# Patient Record
Sex: Female | Born: 1935 | Race: White | Hispanic: No | Marital: Married
Health system: Midwestern US, Academic
[De-identification: ages and names within clinical notes are randomized; demographics above are authoritative.]

---

## 2017-10-27 ENCOUNTER — Encounter: Admit: 2017-10-27 | Discharge: 2017-10-27 | Payer: MEDICARE

## 2017-10-27 ENCOUNTER — Ambulatory Visit: Admit: 2017-10-27 | Discharge: 2017-10-28 | Payer: MEDICARE

## 2017-10-27 DIAGNOSIS — I10 Essential (primary) hypertension: Principal | ICD-10-CM

## 2019-06-01 ENCOUNTER — Ambulatory Visit: Admit: 2019-06-01 | Discharge: 2019-06-02

## 2019-06-01 ENCOUNTER — Encounter: Admit: 2019-06-01 | Discharge: 2019-06-01

## 2019-06-01 DIAGNOSIS — I509 Heart failure, unspecified: Secondary | ICD-10-CM

## 2019-06-01 DIAGNOSIS — R7989 Other specified abnormal findings of blood chemistry: Secondary | ICD-10-CM

## 2019-06-01 NOTE — Progress Notes
[facility]IENT CONSULT FOR HF.

## 2019-07-16 ENCOUNTER — Encounter: Admit: 2019-07-16 | Discharge: 2019-07-16

## 2019-07-16 NOTE — Telephone Encounter
On 07/16/19 Medical records received records from Dr Earvin Hansen. They can be accessed through the [facility] for the 07/27/19 appt with Dr Aris [place].

## 2019-07-16 NOTE — Telephone Encounter
07/16/19 Spoke to pt about records info. Pt states she hasn't seen a cardiologist since last seen here in 2015.   Requested records from Dr. Earvin Hansen edh

## 2019-07-27 ENCOUNTER — Ambulatory Visit: Admit: 2019-07-27 | Discharge: 2019-07-28

## 2019-07-27 ENCOUNTER — Encounter: Admit: 2019-07-27 | Discharge: 2019-07-27

## 2019-07-27 DIAGNOSIS — R0600 Dyspnea, unspecified: Secondary | ICD-10-CM

## 2019-07-27 DIAGNOSIS — I10 Essential (primary) hypertension: Secondary | ICD-10-CM

## 2019-07-27 DIAGNOSIS — E785 Hyperlipidemia, unspecified: Secondary | ICD-10-CM

## 2019-07-27 DIAGNOSIS — I5030 Unspecified diastolic (congestive) heart failure: Secondary | ICD-10-CM

## 2019-07-27 DIAGNOSIS — R0789 Other chest pain: Secondary | ICD-10-CM

## 2019-07-27 DIAGNOSIS — J449 Chronic obstructive pulmonary disease, unspecified: Secondary | ICD-10-CM

## 2019-07-27 DIAGNOSIS — E039 Hypothyroidism, unspecified: Secondary | ICD-10-CM

## 2019-07-27 NOTE — Progress Notes
Patient in clinic today to f/u after seen in [facility] as a consult for LE edema.  Bilateral calf measurements:  L  16"   R  19.5" .  Patient will be returning in 10 days for a nursing visit to reassess edema.  Please measure bilateral calf circumference.  BMP and BNP will be drawn the day prior to nursing visit.  Review with TLR if diuretics need to be adjusted.

## 2019-07-27 NOTE — Progress Notes
Date of Service: 07/27/2019    Priscilla Richard is a 83 y.o. female.       HPI       I saw Ms. Fedak today about her persistent lower extremity edema, COPD, hypertension, hyperlipidemia.  She also has a history of atrial flutter, is on Eliquis.  Her edema has been problematic with lower extremity secondary cellulitis.  I would like to eradicate her edema entirely if possible.  To that end, we are going to increase her metolazone and her potassium supplements, have her back in 10 days for blood pressure, weight, and shin circumference measurement to decide about ongoing diuretic titration.  The husband tells me that her edema is about 50% better on her current regimen, but still a lot of way to go.    ([id number])           Vitals:    07/27/19 1302   BP: 122/68   BP Source: Arm, Right Lower   Pulse: 52   Temp: 36.7 ???C (98.1 ???F)   SpO2: 97%   Height: 1.626 m (5' 4)   PainSc: Zero     Body mass index is 53.73 kg/m???.     Past Medical History  Patient Active Problem List    Diagnosis Date Noted   ??? Chest tightness 01/26/2014   ??? COPD (chronic obstructive pulmonary disease) (HCC) 01/26/2014   ??? Hypertension 01/26/2014   ??? Hyperlipemia 01/26/2014   ??? Hypothyroid 01/26/2014         Review of Systems   Constitution: Negative.   HENT: Negative.    Eyes: Negative.    Cardiovascular: Negative.    Respiratory: Negative.    Endocrine: Negative.    Hematologic/Lymphatic: Negative.    Skin: Negative.    Musculoskeletal: Negative.    Gastrointestinal: Negative.    Genitourinary: Negative.    Neurological: Negative.    Psychiatric/Behavioral: Negative.    Allergic/Immunologic: Negative.        Physical Exam  Examination reveals compression stockings bilaterally but still gross edema to the kneecaps.  Her weight is obese.    ([id number])        Cardiovascular Studies  EKG today shows sinus rhythm.    ([id number])        Problems Addressed Today  No diagnosis found.    Assessment and Plan In summary, this is an 83 year old lady with lower extremity edema, cellulitis, insulin-dependent diabetes mellitus, atrial flutter, pulmonary hypertension.  We are going to try to eradicate her edema entirely by increasing her metolazone, having her back in 10 days, recheck her BNP and BMP at that time, adjust her potassium accordingly, and make adjustments as far as whether or not we are making progress with eradicating her edema.    ([id number])             Current Medications (including today's revisions)  ??? albuterol 0.5% (PROVENTIL; VENTOLIN) 2.5 mg/0.5 mL nebu nebulizer solution Inhale 2.5 mg solution as directed every 6 hours as needed.   ??? albuterol-ipratropium (DUONEB) 0.5 mg-3 mg(2.5 mg base)/3 mL nebulizer solution Inhale 3 mL solution as directed four times daily.   ??? apixaban (ELIQUIS) 2.5 mg tablet Take 2.5 mg by mouth twice daily.   ??? budesonide/formoterol (SYMBICORT) 160/4.5 mcg HFAA inhalation Inhale 2 Puffs by mouth twice daily.   ??? cyanocobalamin (VITAMIN B-12) 1,000 mcg tablet Take 1,000 mcg by mouth daily.   ??? fluticasone (FLONASE) 50 mcg/actuation nasal spray Apply 2 Sprays to each nostril as directed daily.   ???  fluticasone-salmeterol(+) (ADVAIR HFA) 115-21 mcg/actuation inhaler Inhale 2 puffs by mouth into the lungs twice daily.   ??? INSULN ASP PRT/INSULIN ASPART (NOVOLOG MIX 70-30 FLEXPEN SC) Inject 45 Units under the skin twice daily. 40 units AM 30 units PM   ??? latanoprost (XALATAN) 0.005 % ophthalmic solution Place 1 drop into or around eye(s) at bedtime daily.   ??? levothyroxine (SYNTHROID) 112 mcg tablet Take 112 mcg by mouth daily.   ??? loratadine (CLARITIN) 10 mg tablet Take 10 mg by mouth every morning.   ??? metOLazone (ZAROXOLYN) 2.5 mg tablet Take 2.5 mg by mouth. Every other day   ??? montelukast (SINGULAIR) 10 mg tablet Take 10 mg by mouth at bedtime daily.   ??? nystatin (NYSTOP) 100,000 unit/g topical powder Apply  topically to affected area four times daily. ??? potassium chloride SR (K-DUR) 20 mEq tablet Take 60 mEq by mouth daily.   ??? rosuvastatin (CRESTOR) 10 mg tablet Take 10 mg by mouth daily.   ??? tamoxifen (NOLVADEX) 20 mg tablet Take 20 mg by mouth daily.   ??? tolterodine LA(+) (DETROL LA) 4 mg capsule Take 4 mg by mouth daily.   ??? torsemide (DEMADEX) 10 mg tablet Take 10 mg by mouth daily.

## 2019-07-30 ENCOUNTER — Encounter: Admit: 2019-07-30 | Discharge: 2019-07-30

## 2019-07-30 DIAGNOSIS — I10 Essential (primary) hypertension: Secondary | ICD-10-CM

## 2019-07-30 DIAGNOSIS — R0600 Dyspnea, unspecified: Secondary | ICD-10-CM

## 2019-07-30 DIAGNOSIS — R0789 Other chest pain: Secondary | ICD-10-CM

## 2019-07-30 DIAGNOSIS — I5030 Unspecified diastolic (congestive) heart failure: Secondary | ICD-10-CM

## 2019-07-30 LAB — BASIC METABOLIC PANEL
Lab: 1.1 — ABNORMAL HIGH (ref 0.57–1.11)
Lab: 136 — ABNORMAL HIGH (ref 70–105)
Lab: 22 — ABNORMAL HIGH (ref 9.8–20.1)
Lab: 9.3
Lab: 100 mL/min — ABNORMAL LOW (ref >60)
Lab: 144 mg/dL — ABNORMAL HIGH (ref 0.4–1.24)
Lab: 3.6 mg/dL (ref 8.5–10.6)
Lab: 31 mL/min — ABNORMAL LOW (ref >60)

## 2019-08-04 LAB — BNP (B-TYPE NATRIURETIC PEPTI): Lab: 117 g/dL — ABNORMAL HIGH (ref 0–100)

## 2019-08-05 ENCOUNTER — Encounter: Admit: 2019-08-05 | Discharge: 2019-08-05

## 2019-08-05 DIAGNOSIS — R0600 Dyspnea, unspecified: Secondary | ICD-10-CM

## 2019-08-05 DIAGNOSIS — I5030 Unspecified diastolic (congestive) heart failure: Secondary | ICD-10-CM

## 2019-08-05 DIAGNOSIS — R600 Localized edema: Secondary | ICD-10-CM

## 2019-08-05 DIAGNOSIS — R0789 Other chest pain: Secondary | ICD-10-CM

## 2019-08-05 DIAGNOSIS — I10 Essential (primary) hypertension: Secondary | ICD-10-CM

## 2019-08-06 MED ORDER — METOLAZONE 2.5 MG PO TAB
5 mg | ORAL_TABLET | Freq: Every day | ORAL | 0 refills | 84.00000 days | Status: DC
Start: 2019-08-06 — End: 2019-08-09

## 2019-08-06 NOTE — Progress Notes
Patient presents to clinic for reassessment of LE edema and blood pressure check after recently increasing spironolactone.  Her stated weight today is 291.  Which is down from 295.  Her bp today is 124/60 p 77.  Patient reports she is feeling better and has no complaints.  Measurements of LE calf: R 19" and L is 17".  Which R is done 0.5" and L is sl increased, which could be related to how the pt has her legs wrapped with ace wrap.  Patient was instructed to increase metolazone for 10 days then resume her usual dose.  Plan to review with TLR and call pt back if he has further recommendations.

## 2019-08-06 NOTE — Progress Notes
Reviewed with TLR.  Plan to increase metolazone to 5 mg daily, recheck LE edema, and bmp in 2 weeks.  Pt will monitor weight at home. Most recent weight was 291.  Called pt with instructions and she verbalizes understanding. Lab order faxed to [facility] and copy mailed to patient.

## 2019-08-07 ENCOUNTER — Encounter: Admit: 2019-08-07 | Discharge: 2019-08-07

## 2019-08-09 ENCOUNTER — Encounter: Admit: 2019-08-09 | Discharge: 2019-08-09

## 2019-08-09 MED ORDER — METOLAZONE 2.5 MG PO TAB
5 mg | ORAL_TABLET | Freq: Every day | ORAL | 3 refills | 84.00000 days | Status: DC
Start: 2019-08-09 — End: 2019-08-09

## 2019-08-09 MED ORDER — METOLAZONE 5 MG PO TAB
5 mg | ORAL_TABLET | Freq: Every day | ORAL | 3 refills | 84.00000 days | Status: DC
Start: 2019-08-09 — End: 2019-08-25

## 2019-08-23 LAB — BASIC METABOLIC PANEL
Lab: 1.2 — ABNORMAL HIGH (ref 0.57–1.11)
Lab: 143 (ref 3–12)
Lab: 18 — ABNORMAL HIGH (ref 0–14)
Lab: 203 — ABNORMAL HIGH (ref 70–105)
Lab: 3.7 mL/min (ref >60)
Lab: 9.1
Lab: 97 mL/min — ABNORMAL LOW (ref 98–107)

## 2019-08-24 ENCOUNTER — Encounter: Admit: 2019-08-24 | Discharge: 2019-08-24

## 2019-08-24 DIAGNOSIS — I10 Essential (primary) hypertension: Secondary | ICD-10-CM

## 2019-08-24 DIAGNOSIS — R600 Localized edema: Secondary | ICD-10-CM

## 2019-08-24 NOTE — Progress Notes
Patient presents to clinic for bp check and to assess LE edema.  Today bp is 134/74 p 66.  Pt weight is down to 288 lb. R calf circumference 18 3/4"  L 18 1/2 ".  Patient is taking metolazone 5 mg daily.  Weight is down, bp is stable and pt has no complaints. Pt will continue to monitor bp and weight and call our office with questions or concerns.  Will review with TLR metolazone dose, pt is concerned taking 5 mg daily since sl bump in creatinine. Asked pt to take metolazone 5 mg every other day until review with TLR.  Plan to call pt with recommendations.

## 2019-08-25 ENCOUNTER — Encounter: Admit: 2019-08-25 | Discharge: 2019-08-25

## 2019-08-25 MED ORDER — METOLAZONE 5 MG PO TAB
5 mg | ORAL_TABLET | ORAL | 3 refills | 84.00000 days | Status: DC
Start: 2019-08-25 — End: 2020-07-21

## 2019-08-25 NOTE — Telephone Encounter
-----   Message from Betsy Pries, RN sent at 08/24/2019  4:38 PM CDT -----  Regarding: metolazone dose  Ask TLR

## 2019-10-05 ENCOUNTER — Encounter: Admit: 2019-10-05 | Discharge: 2019-10-05 | Payer: MEDICARE

## 2019-10-11 ENCOUNTER — Encounter: Admit: 2019-10-11 | Discharge: 2019-10-11 | Payer: MEDICARE

## 2019-10-12 ENCOUNTER — Encounter: Admit: 2019-10-12 | Discharge: 2019-10-12 | Payer: MEDICARE

## 2019-10-12 DIAGNOSIS — E785 Hyperlipidemia, unspecified: Secondary | ICD-10-CM

## 2019-10-12 DIAGNOSIS — R0600 Dyspnea, unspecified: Secondary | ICD-10-CM

## 2019-10-12 DIAGNOSIS — E039 Hypothyroidism, unspecified: Secondary | ICD-10-CM

## 2019-10-12 DIAGNOSIS — R0789 Other chest pain: Secondary | ICD-10-CM

## 2019-10-12 DIAGNOSIS — I10 Essential (primary) hypertension: Secondary | ICD-10-CM

## 2019-10-12 DIAGNOSIS — J449 Chronic obstructive pulmonary disease, unspecified: Secondary | ICD-10-CM

## 2019-10-14 LAB — BNP (B-TYPE NATRIURETIC PEPTI): Lab: 81 (ref 3–12)

## 2019-10-14 LAB — BASIC METABOLIC PANEL
Lab: 1.1 K/UL — ABNORMAL HIGH (ref 0.57–1.11)
Lab: 142 % — ABNORMAL LOW (ref 36–45)
Lab: 17 % — ABNORMAL HIGH (ref 0–14)
Lab: 199 FL — ABNORMAL HIGH (ref 70–105)
Lab: 23 % — ABNORMAL HIGH (ref 9.8–20.1)
Lab: 3.3 FL — ABNORMAL LOW (ref 3.5–5.1)
Lab: 32 g/dL — ABNORMAL HIGH (ref 23–31)
Lab: 47 % — ABNORMAL LOW (ref >59)
Lab: 9.1 % (ref 41–77)
Lab: 96 pg — ABNORMAL LOW (ref 98–107)

## 2019-10-14 LAB — MAGNESIUM: Lab: 2 U/L (ref 25–110)

## 2019-10-15 ENCOUNTER — Encounter: Admit: 2019-10-15 | Discharge: 2019-10-15 | Payer: MEDICARE

## 2019-10-15 DIAGNOSIS — R0789 Other chest pain: Secondary | ICD-10-CM

## 2019-10-15 DIAGNOSIS — I10 Essential (primary) hypertension: Secondary | ICD-10-CM

## 2019-10-15 DIAGNOSIS — E785 Hyperlipidemia, unspecified: Secondary | ICD-10-CM

## 2019-10-15 DIAGNOSIS — E039 Hypothyroidism, unspecified: Secondary | ICD-10-CM

## 2020-01-04 ENCOUNTER — Encounter: Admit: 2020-01-04 | Discharge: 2020-01-04 | Payer: MEDICARE

## 2020-01-04 DIAGNOSIS — R0789 Other chest pain: Secondary | ICD-10-CM

## 2020-01-04 DIAGNOSIS — I10 Essential (primary) hypertension: Secondary | ICD-10-CM

## 2020-01-04 DIAGNOSIS — E039 Hypothyroidism, unspecified: Secondary | ICD-10-CM

## 2020-01-04 DIAGNOSIS — J449 Chronic obstructive pulmonary disease, unspecified: Secondary | ICD-10-CM

## 2020-01-04 DIAGNOSIS — E785 Hyperlipidemia, unspecified: Secondary | ICD-10-CM

## 2020-01-04 DIAGNOSIS — R0600 Dyspnea, unspecified: Secondary | ICD-10-CM

## 2020-04-04 ENCOUNTER — Encounter: Admit: 2020-04-04 | Discharge: 2020-04-04 | Payer: MEDICARE

## 2020-04-04 DIAGNOSIS — J449 Chronic obstructive pulmonary disease, unspecified: Secondary | ICD-10-CM

## 2020-04-04 DIAGNOSIS — E039 Hypothyroidism, unspecified: Secondary | ICD-10-CM

## 2020-04-04 DIAGNOSIS — R0789 Other chest pain: Secondary | ICD-10-CM

## 2020-04-04 DIAGNOSIS — E785 Hyperlipidemia, unspecified: Secondary | ICD-10-CM

## 2020-04-04 DIAGNOSIS — I10 Essential (primary) hypertension: Secondary | ICD-10-CM

## 2020-04-04 DIAGNOSIS — R0600 Dyspnea, unspecified: Secondary | ICD-10-CM

## 2020-04-04 NOTE — Patient Instructions
Hold Crestor x 2weeks  Approx 4/20  Call us to report if this has improved symptoms     Follow up as directed.  Call sooner if issues.  Call the [place] nursing line at [phone].  Leave a detailed message for the nurse in [place] with how we can assist you and we will call you back.

## 2020-05-30 ENCOUNTER — Encounter: Admit: 2020-05-30 | Discharge: 2020-05-30 | Payer: MEDICARE

## 2020-06-30 ENCOUNTER — Encounter: Admit: 2020-06-30 | Discharge: 2020-06-30 | Payer: MEDICARE

## 2020-07-18 ENCOUNTER — Encounter: Admit: 2020-07-18 | Discharge: 2020-07-18 | Payer: MEDICARE

## 2020-07-18 DIAGNOSIS — E785 Hyperlipidemia, unspecified: Secondary | ICD-10-CM

## 2020-07-18 LAB — LIPID PROFILE
Lab: 95
Lab: 114 MMOL/L — ABNORMAL LOW (ref 100–108)
Lab: 151
Lab: 23 mg/dL — ABNORMAL HIGH (ref 70–99)
Lab: 33 MMOL/L — ABNORMAL LOW (ref >40)
Lab: 5 — ABNORMAL LOW (ref >60)

## 2020-07-20 ENCOUNTER — Encounter: Admit: 2020-07-20 | Discharge: 2020-07-20 | Payer: MEDICARE

## 2020-07-20 DIAGNOSIS — E785 Hyperlipidemia, unspecified: Secondary | ICD-10-CM

## 2020-07-20 DIAGNOSIS — I10 Essential (primary) hypertension: Secondary | ICD-10-CM

## 2020-07-20 DIAGNOSIS — E039 Hypothyroidism, unspecified: Secondary | ICD-10-CM

## 2020-07-20 DIAGNOSIS — R0789 Other chest pain: Secondary | ICD-10-CM

## 2020-07-20 DIAGNOSIS — R29898 Other symptoms and signs involving the musculoskeletal system: Secondary | ICD-10-CM

## 2020-07-20 DIAGNOSIS — J449 Chronic obstructive pulmonary disease, unspecified: Secondary | ICD-10-CM

## 2020-07-20 DIAGNOSIS — R0600 Dyspnea, unspecified: Secondary | ICD-10-CM

## 2020-07-20 DIAGNOSIS — M48061 Spinal stenosis, lumbar region without neurogenic claudication: Secondary | ICD-10-CM

## 2020-07-20 NOTE — Patient Instructions
Lumbar Spine MRI at [facility]    Scheduling [phone]

## 2020-07-21 ENCOUNTER — Encounter: Admit: 2020-07-21 | Discharge: 2020-07-21 | Payer: MEDICARE

## 2020-07-21 MED ORDER — METOLAZONE 5 MG PO TAB
2.5 mg | ORAL_TABLET | ORAL | 1 refills | 84.00000 days | Status: AC
Start: 2020-07-21 — End: ?

## 2021-04-10 ENCOUNTER — Encounter: Admit: 2021-04-10 | Discharge: 2021-04-10 | Payer: MEDICARE

## 2021-04-10 MED ORDER — METOLAZONE 5 MG PO TAB
ORAL_TABLET | ORAL | 2 refills | 84.00000 days | Status: AC
Start: 2021-04-10 — End: ?

## 2021-10-30 DEATH — deceased

## 2022-01-13 ENCOUNTER — Encounter: Admit: 2022-01-13 | Discharge: 2022-01-13 | Payer: MEDICARE

## 2022-01-13 MED ORDER — METOLAZONE 5 MG PO TAB
ORAL_TABLET | 2 refills
Start: 2022-01-13 — End: ?

## 2022-01-14 IMAGING — CT BRAIN WO(Adult)
3 of 4 series · 15 of 47 positions shown, 18 images · non-contrast
Comparison: none

[Series 4: brain cor 5.00 hr40 s3 · coronal · 0.33mm/px · 3 of 42 slices shown]
[im 14/42  brain]
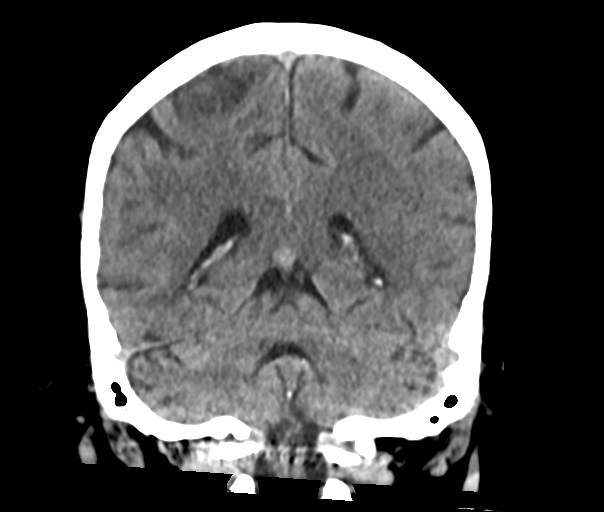
[im 19/42  brain]
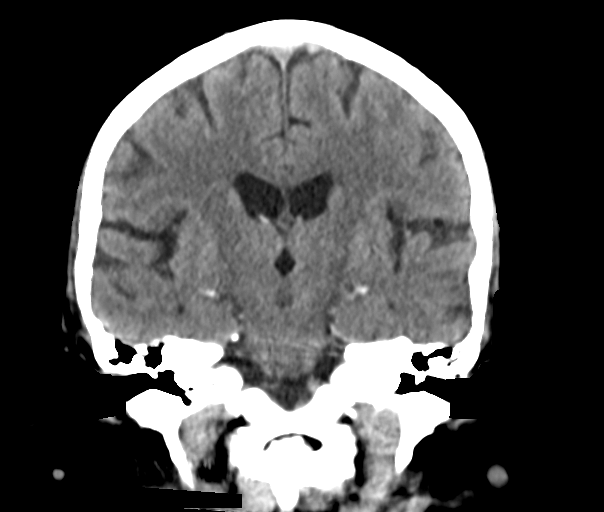
[im 23/42  brain]
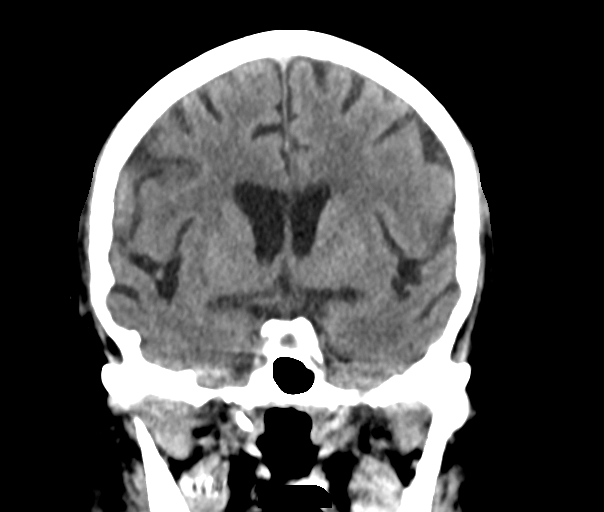

[Series 6: brain sag 5.00 hr40 s3 · sagittal · 0.33mm/px · 3 of 40 slices shown]
[im 14/40  brain]
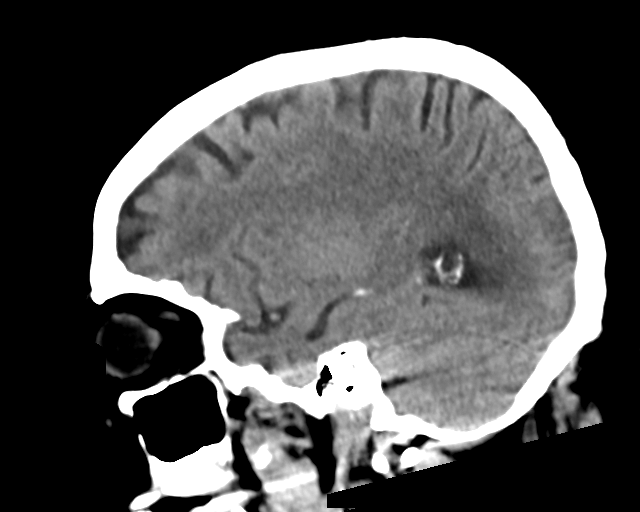
[im 20/40  brain]
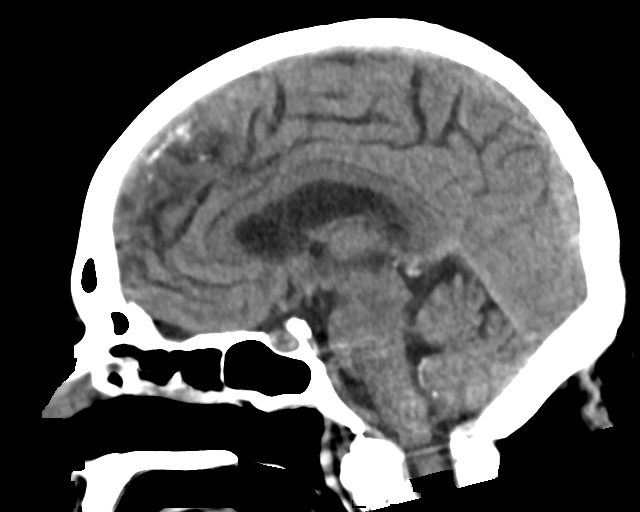
[im 27/40  brain]
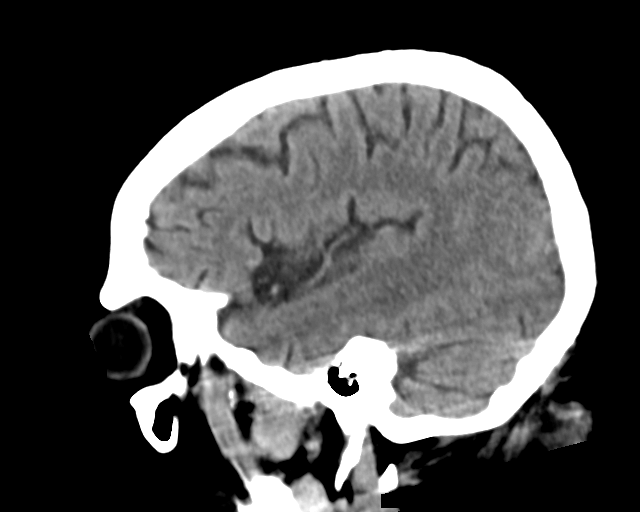

[Series 8: brain ax 2.00 hr60 s3 · axial · 0.39mm/px · z∈[-611,-480]mm · 9 of 84 slices shown, 12 images]
[im 8/84  brain]
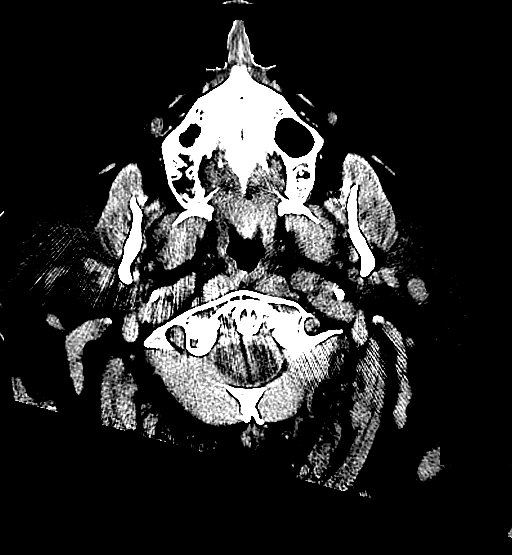
[im 8/84  bone]
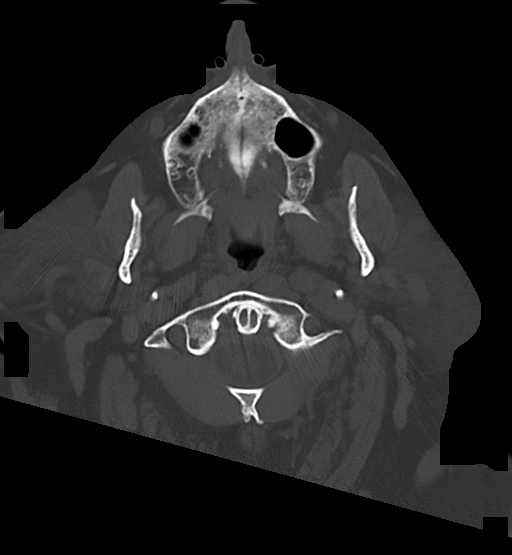
[im 16/84  brain]
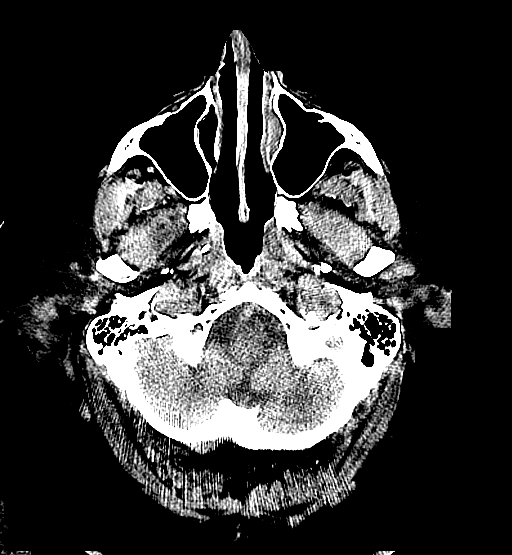
[im 24/84  brain]
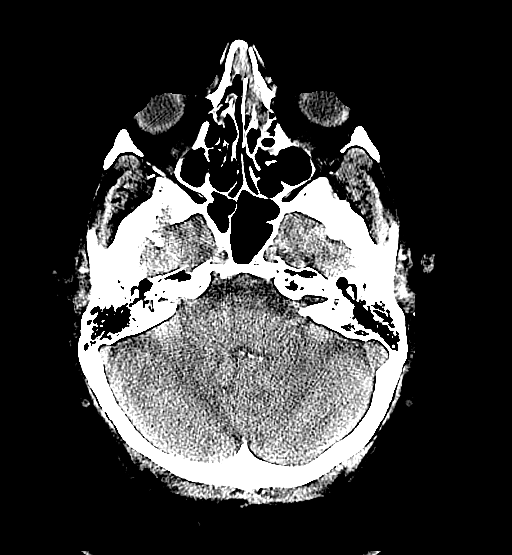
[im 32/84  brain]
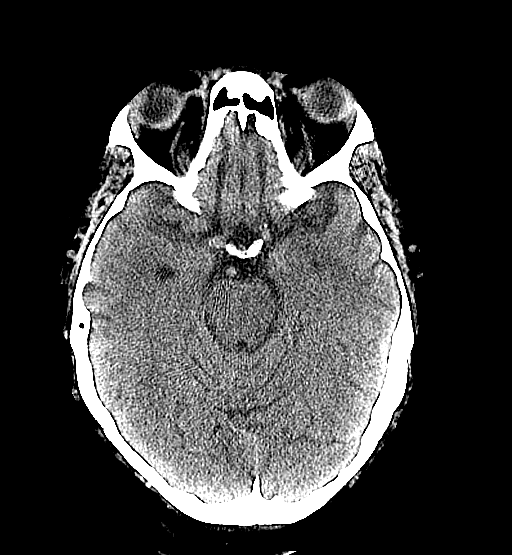
[im 44/84  brain]
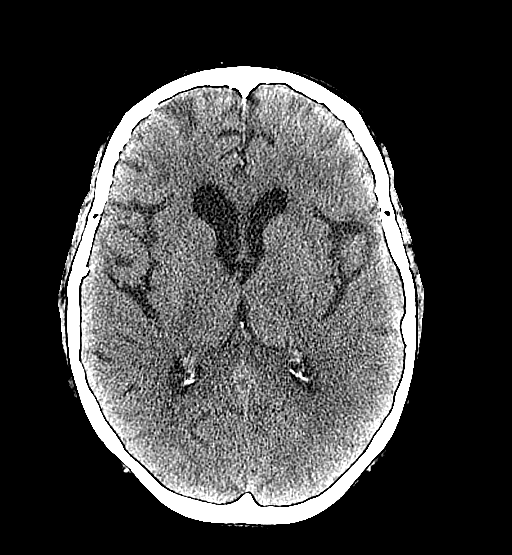
[im 44/84  bone]
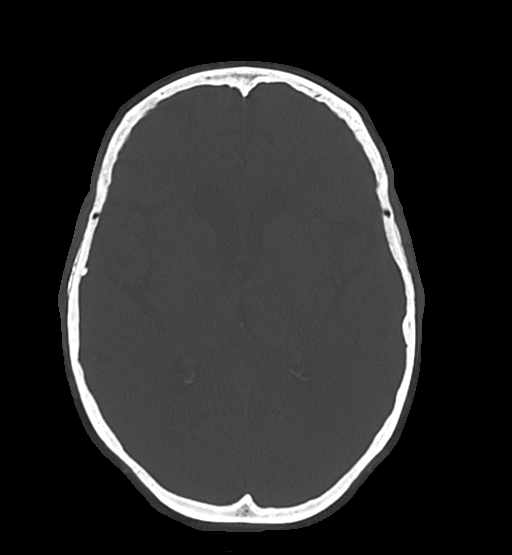
[im 52/84  brain]
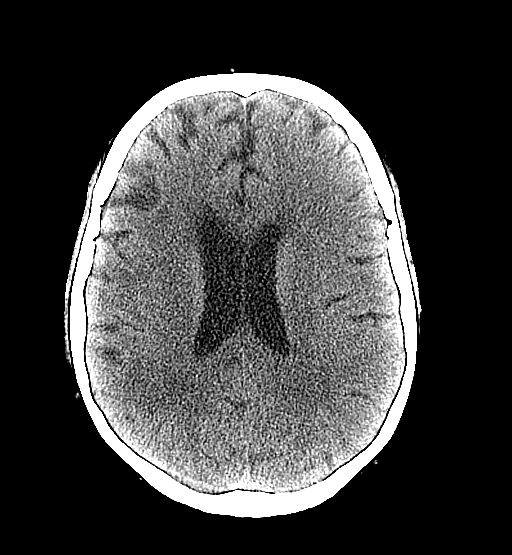
[im 60/84  brain]
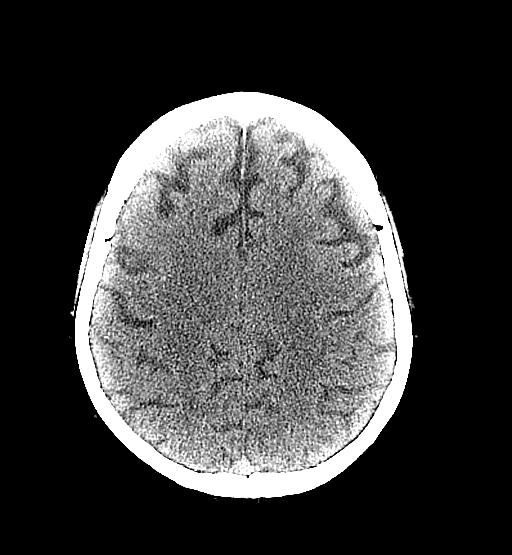
[im 68/84  brain]
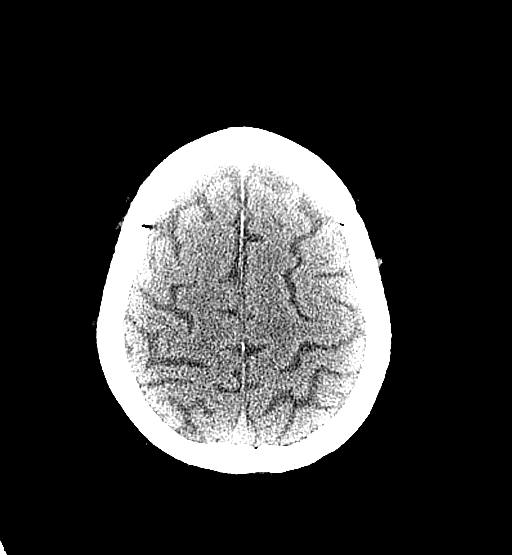
[im 76/84  brain]
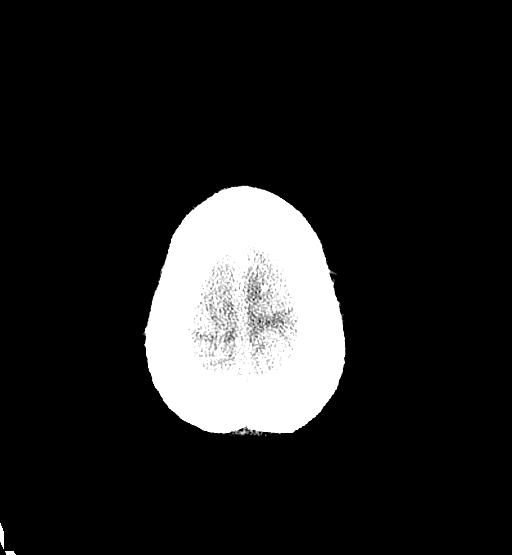
[im 76/84  bone]
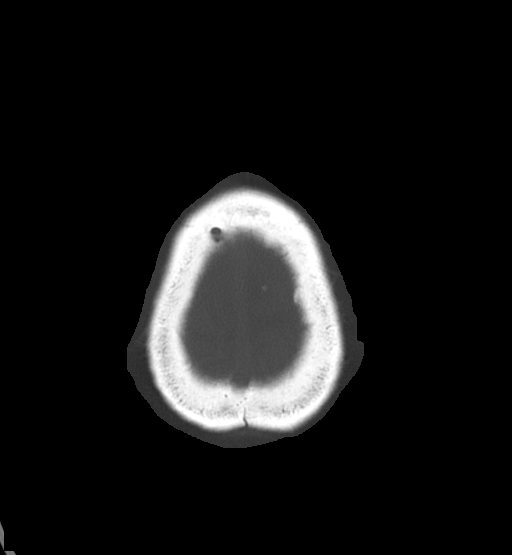

[15 of 47 positions shown; findings below may reference images not displayed]

DIAGNOSTIC STUDIES

EXAM

CT head without contrast.

INDICATION

new onset weakness
WEAKNESS. CT/NM 0/0. TJ/KF

TECHNIQUE

All CT scans at this facility use dose modulation, iterative reconstruction, and/or weight based
dosing when appropriate to reduce radiation dose to as low as reasonably achievable.

Number of previous computed tomography exams in the last 12 months is 0 .

Number of previous nuclear medicine myocardial perfusion studies in the last 12 months is 0 .

COMPARISONS

None available

FINDINGS

There is extensive cortical atrophy and proportional enlargement of the ventricular system. Small
vessel ischemic disease is evident. No intracranial hemorrhage or mass effect is seen. Bony
calvarium is normal. Calcification of the cavernous carotid arteries is evident.

IMPRESSION

Cortical atrophy and small vessel ischemic disease. No intracranial mass or hemorrhage is seen.

Tech Notes:

WEAKNESS. CT/NM 0/0. TJ/KF

## 2022-01-16 IMAGING — CR [ID]
2 series · 3 of 3 positions shown · non-contrast
Comparison: none

[chest ap]
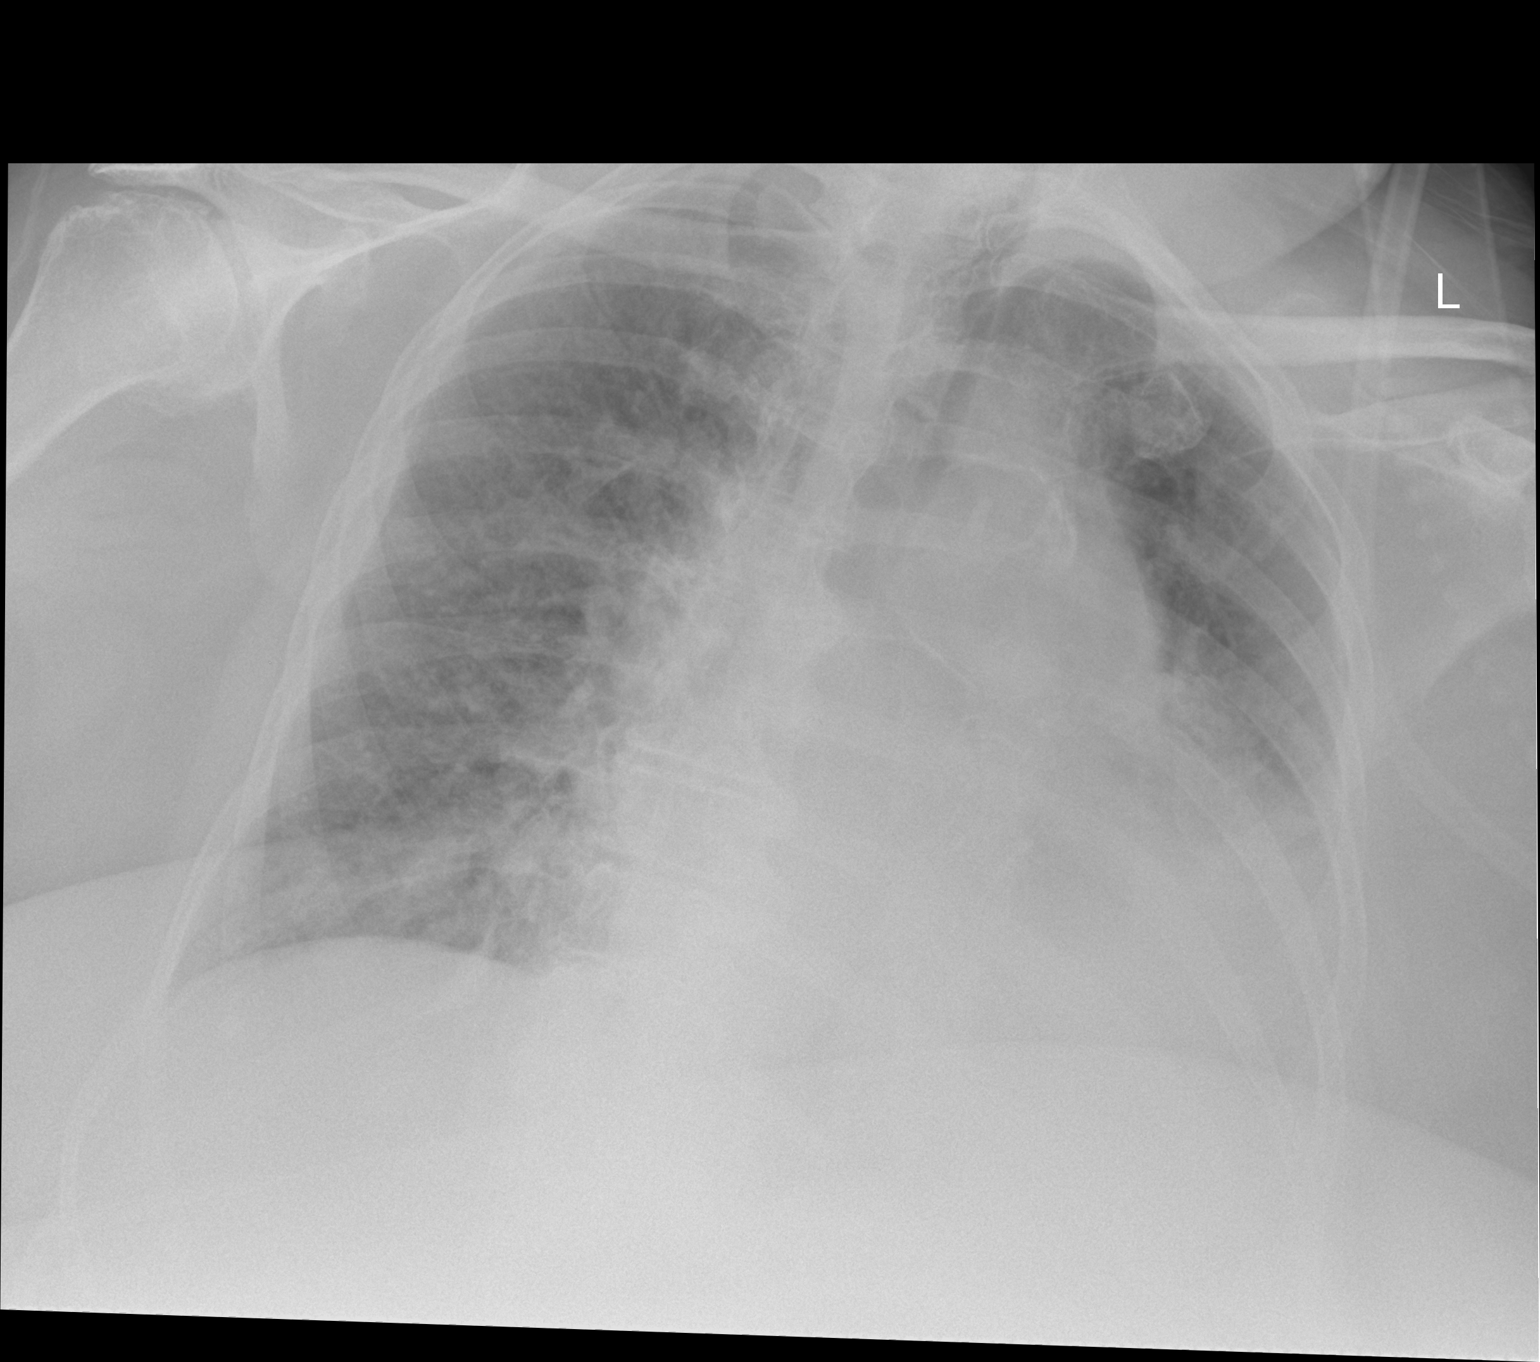

[chest preemie ap · 0.14mm/px · 2 of 2 slices shown]
[im 1/2]
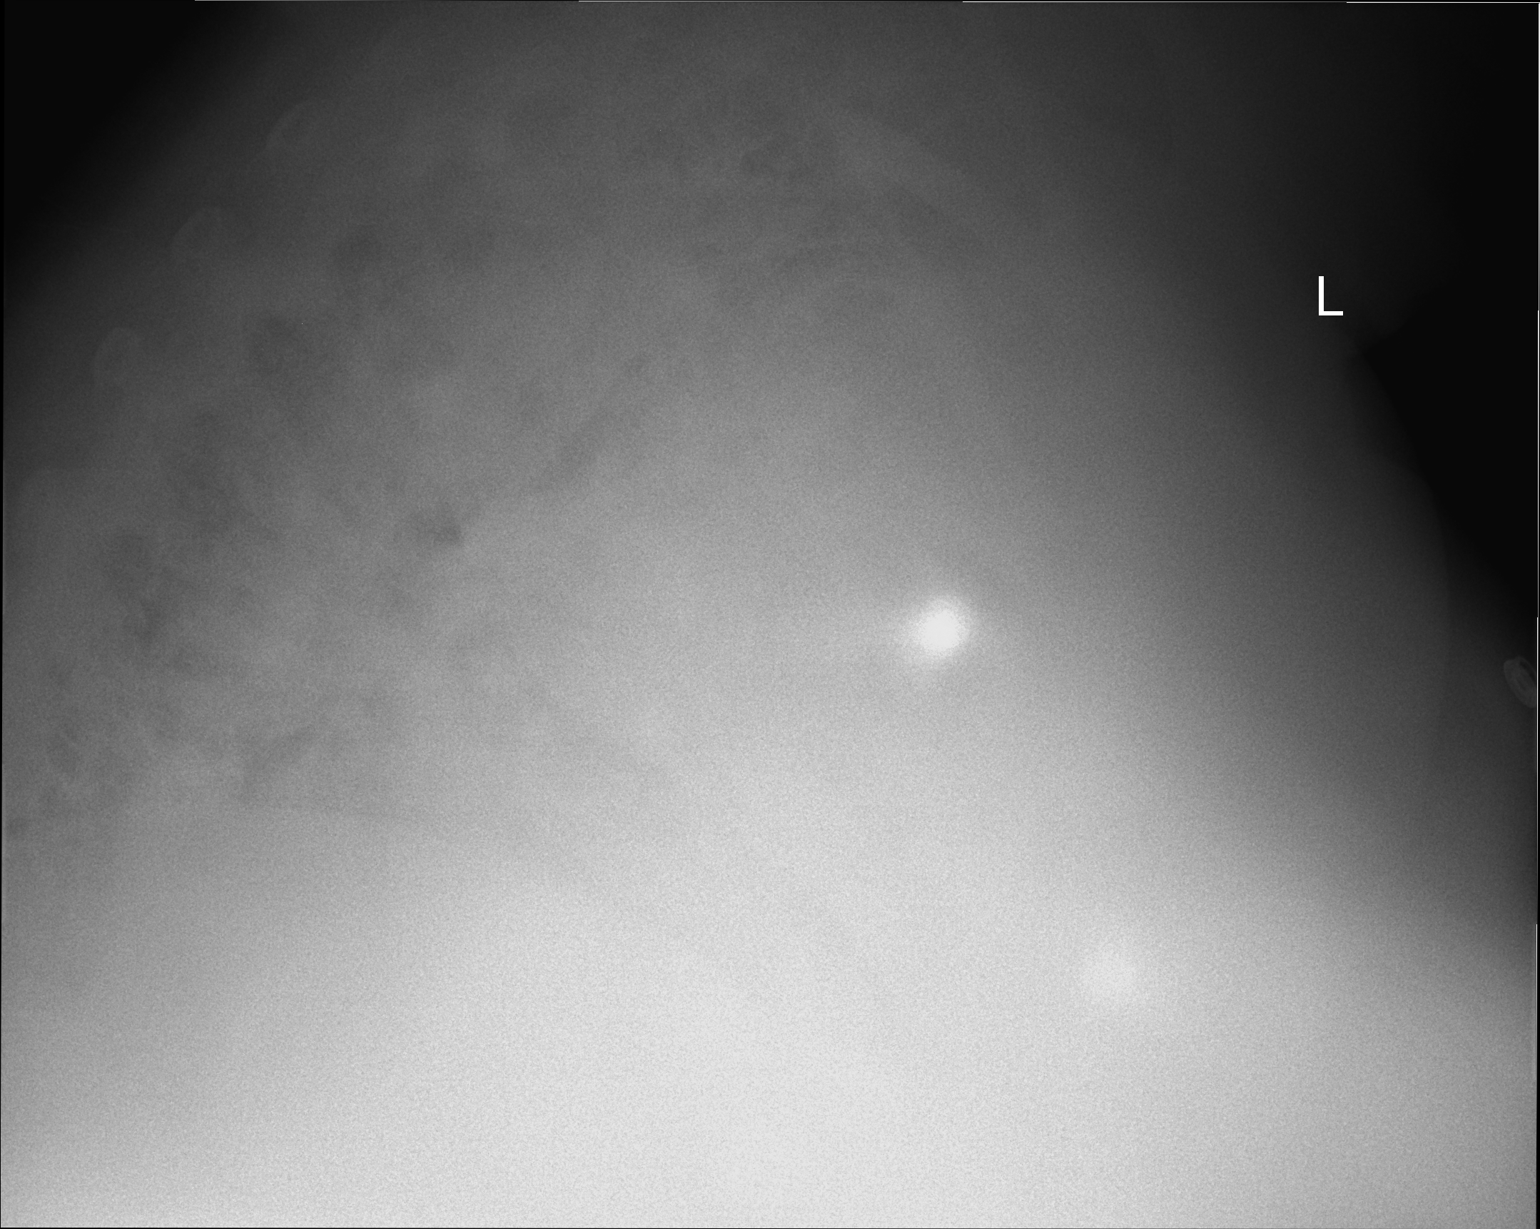
[im 2/2]
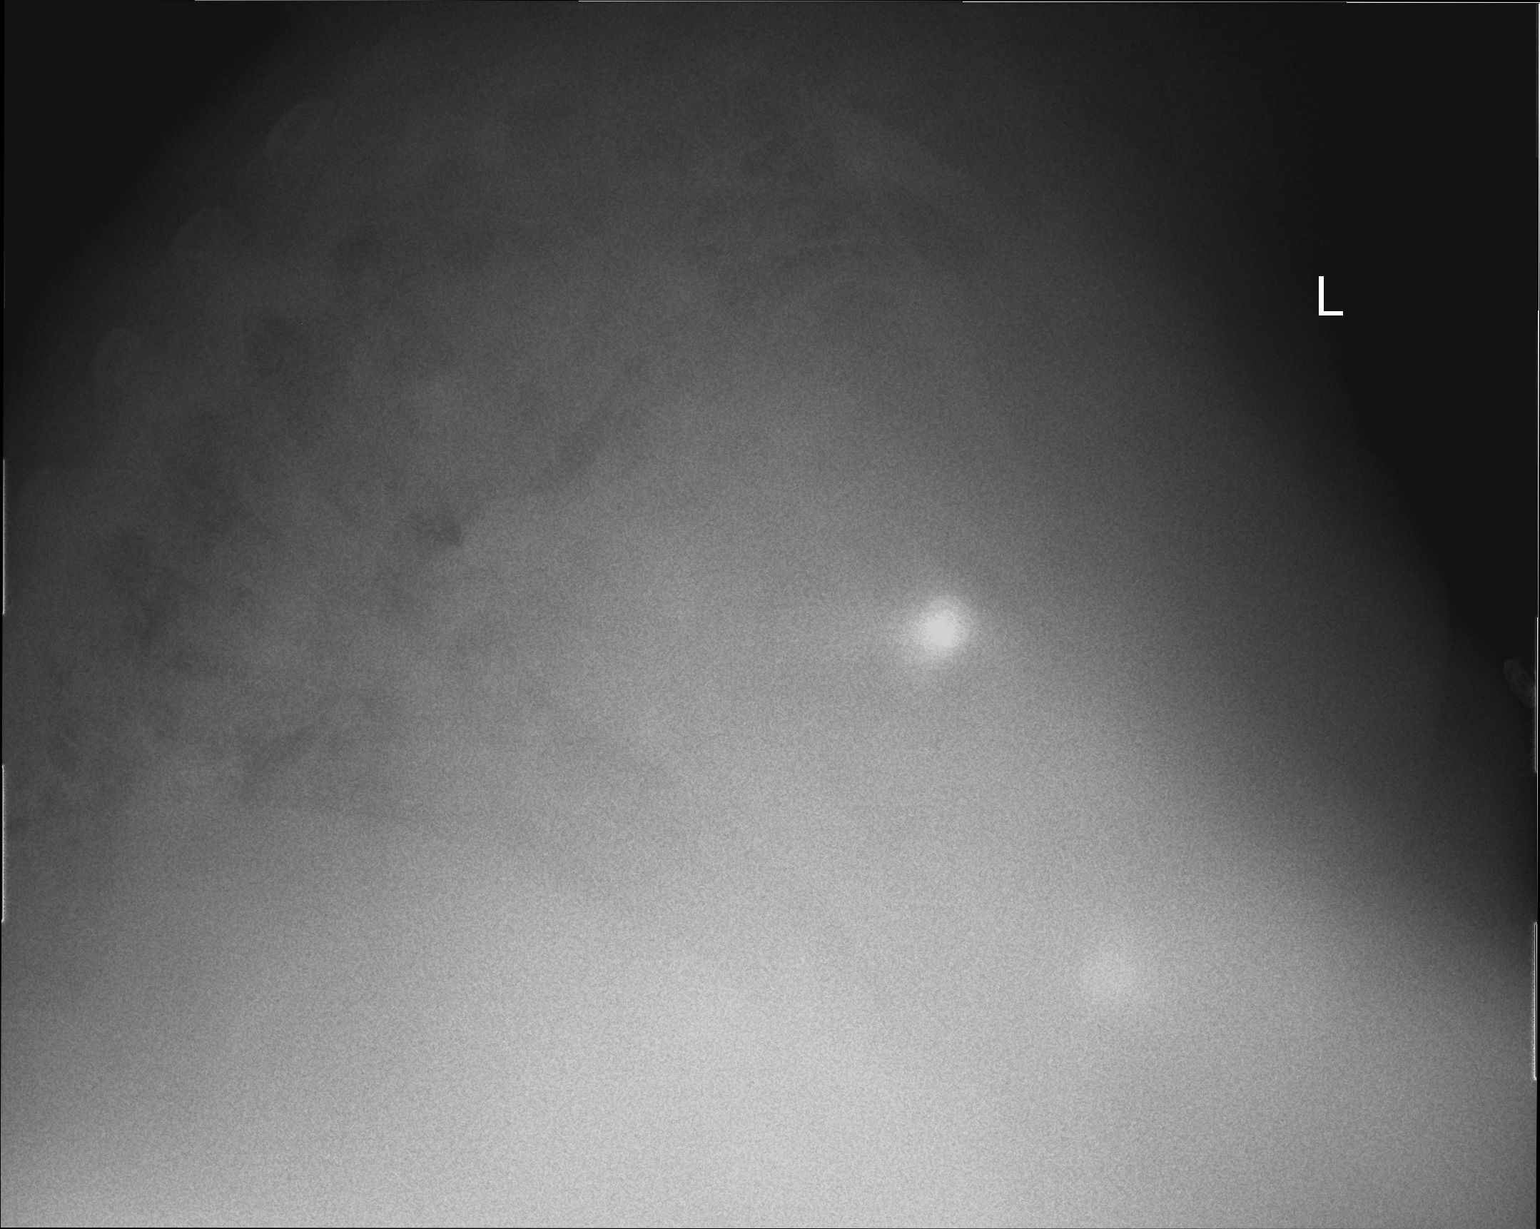

[3 of 3 positions shown; findings below may reference images not displayed]

EXAM

RADIOLOGICAL EXAMINATION, CHEST; 2 VIEWS FRONTAL AND LATERAL CPT 59858

INDICATION

On Bi-pap SOB TJ/KF

TECHNIQUE

2 views of the chest were acquired.

COMPARISONS

Previous examination dated 03/23/2021

FINDINGS

The cardiac silhouette is enlarged. The patient is rotated to the left. Bilateral hazy opacity of
the lung fields is seen diffusely. The pulmonary vasculature is congested centrally. The aortic arch
is calcified. The left costophrenic angle is blunted. Thoracic spondylosis is noted.

IMPRESSION

Cardiomegaly. Central pulmonary vascular congestion. Interstitial edema pattern is present. The
left costophrenic angle is blunted.

Tech Notes:

pt on bi-pap- SOB TJ/KF

## 2022-04-08 ENCOUNTER — Encounter: Admit: 2022-04-08 | Discharge: 2022-04-08 | Payer: MEDICARE

## 2022-04-08 MED ORDER — METOLAZONE 5 MG PO TAB
ORAL_TABLET | 0 refills
Start: 2022-04-08 — End: ?
